# Patient Record
Sex: Male | Born: 1985 | Race: Black or African American | Hispanic: No | Marital: Single | Smoking: Never smoker
Health system: Southern US, Community
[De-identification: ages and names within clinical notes are randomized; demographics above are authoritative.]

## PROBLEM LIST (undated history)

## (undated) DIAGNOSIS — I341 Nonrheumatic mitral (valve) prolapse: Secondary | ICD-10-CM

## (undated) DIAGNOSIS — K648 Other hemorrhoids: Secondary | ICD-10-CM

## (undated) HISTORY — DX: Nonrheumatic mitral (valve) prolapse: I34.1

## (undated) HISTORY — PX: COLONOSCOPY: SHX174

## (undated) HISTORY — DX: Other hemorrhoids: K64.8

---

## 2016-06-06 DIAGNOSIS — K625 Hemorrhage of anus and rectum: Secondary | ICD-10-CM | POA: Diagnosis not present

## 2016-06-06 DIAGNOSIS — K529 Noninfective gastroenteritis and colitis, unspecified: Secondary | ICD-10-CM | POA: Diagnosis not present

## 2016-06-19 DIAGNOSIS — K648 Other hemorrhoids: Secondary | ICD-10-CM | POA: Diagnosis not present

## 2016-06-19 DIAGNOSIS — K640 First degree hemorrhoids: Secondary | ICD-10-CM | POA: Diagnosis not present

## 2016-06-19 DIAGNOSIS — R197 Diarrhea, unspecified: Secondary | ICD-10-CM | POA: Diagnosis not present

## 2016-06-19 DIAGNOSIS — K625 Hemorrhage of anus and rectum: Secondary | ICD-10-CM | POA: Diagnosis not present

## 2016-07-30 ENCOUNTER — Encounter: Payer: Self-pay | Admitting: Medical

## 2016-07-30 ENCOUNTER — Ambulatory Visit: Payer: Self-pay | Admitting: Medical

## 2016-07-30 VITALS — BP 120/90 | HR 64 | Temp 97.7°F | Resp 16 | Ht 67.0 in | Wt 200.0 lb

## 2016-07-30 DIAGNOSIS — R002 Palpitations: Secondary | ICD-10-CM

## 2016-07-30 DIAGNOSIS — K640 First degree hemorrhoids: Secondary | ICD-10-CM

## 2016-07-30 NOTE — Progress Notes (Signed)
Subjective:    Tyler Davis is a 31 y.o. male who presents with palpitations resuming one month ago. The symptoms are mild to moderate, occur intermittently and up to  5 times /day., and last only a few seconds per episode. They tend to occur while sitting down. Cardiac risk factors include: male gender No family history of cardiac issues. Aggravating factors: none. Relieving factors: none. Associated symptoms: chest tightness. Patient denies: chest pain, dizziness, shortness of breath and syncope. He does drink 4 cups of coffee throughout the day.  [facility]oscopy diagnosed with internal hemorrhoids.  He states his blood pressure was elevated during his visits 160-170/100. He does say he was anxious during his visits.  Pt also c/o  Dizziness, starting about 3 weeks ago.   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 No CP or SOB. Nothing worsens it, and he feels better when he exercises.  Denies room spinning, he feels like the floor is moving. Initially had dizziness when he travel to [place] and he thought it was due to the altitude change. When he returned to [place] the dizziness resolved, He again noticed it [place] in [place]. This time it did not resolve when her returned to [place]. Denies any tinnitus, trouble or changes in  Hearing. It occurs 2-3 times/day he describes it as mild to moderate. Lasting about  5-10 min. He denies any syncope or other CNS symptoms.    Review of Systems Constitutional: See HPI , he denies any fever or recent illnesses. Ears, nose, mouth, throat, and face: negative Respiratory: negative Cardiovascular: see HPI Neurological: negative, see HPI   Objective:    BP 120/90 (BP Location: Left Arm, Patient Position: Sitting, Cuff Size: Normal)   Pulse 64   Temp 97.7 F (36.5 C) (Tympanic)   Resp 16   Ht 5\' 7"  (1.702 m)   Wt 200 lb (90.7 kg)   SpO2 99%   BMI 31.32 kg/m   General appearance: alert, cooperative and no distress Head: Normocephalic, without obvious abnormality, atraumatic Ears:  Cerumen obscuring left Tm, only 1/4 of right TM visible otherwise with Cerumen. TM looks dark.. Lungs: clear to auscultation bilaterally Heart: regular rate and rhythm, S1, S2 normal, no murmur, click, rub or gallop Extremities: MAEW Neurologic: Grossly normal neg Rhomberg, finger to nose, heel to toe wnl. Gait wnl.   Cardiographics ECG: Bradycardic , mild nonspecific inferior ST changes (reviewed by Dr. Sullivan LoneGilbert).   Assessment:    Palpitations   Dizziness    Plan:  Referral to Cardiology - will call patientt with time and date and place. Pt to use otc Debrox otic drops 5-10 drops twice a day x  4 days and return to the clinic for irrigation.  Follow up in 4 days.

## 2016-07-30 NOTE — Progress Notes (Deleted)
Subjective:     Tyler Davis is a 31 y.o. male who presents for evaluation of dizziness. The symptoms started 3 week ago and are stable. The attacks occur {frequency:19169} and last {time dizziness:13553} {time units:19177::"minutes"}. Positions that worsen symptoms: {dizziness positions:13554}. Previous workup/treatments: {dizziness eval/tx:13555::"none"}. Associated ear symptoms: {dizziness ear symptoms:13556::"none"}. Associated CNS symptoms: {dizziness cns symptoms:13559::"none"}. Recent infections: {dizziness infections:13560}. Head trauma: {dizziness head trauma:13561}. Drug ingestion: {dizziness ingestion:13562}. Noise exposure: {dizziness noise:13563}. Family history: {dizziness fam hx:13565}.  {Common ambulatory SmartLinks:19316}  Review of Systems {ros; complete:30496}    Objective:    {exam; complete:17964}      Assessment:    {dizziness assessment:60519}    Plan:    {Vertigo treatment plan:17820}

## 2016-08-10 ENCOUNTER — Other Ambulatory Visit
Admission: RE | Admit: 2016-08-10 | Discharge: 2016-08-10 | Disposition: A | Discharge status: Home / Self Care | Payer: BLUE CROSS/BLUE SHIELD | Source: Ambulatory Visit | Attending: Student | Admitting: Student

## 2016-08-10 DIAGNOSIS — K529 Noninfective gastroenteritis and colitis, unspecified: Secondary | ICD-10-CM | POA: Diagnosis not present

## 2016-08-10 DIAGNOSIS — K625 Hemorrhage of anus and rectum: Secondary | ICD-10-CM | POA: Diagnosis not present

## 2016-08-15 ENCOUNTER — Encounter: Payer: Self-pay | Admitting: Internal Medicine

## 2016-08-15 ENCOUNTER — Ambulatory Visit (INDEPENDENT_AMBULATORY_CARE_PROVIDER_SITE_OTHER): Payer: BLUE CROSS/BLUE SHIELD | Admitting: Internal Medicine

## 2016-08-15 VITALS — BP 164/72 | HR 72 | Ht 67.0 in | Wt 200.2 lb

## 2016-08-15 DIAGNOSIS — R002 Palpitations: Secondary | ICD-10-CM | POA: Diagnosis not present

## 2016-08-15 DIAGNOSIS — R030 Elevated blood-pressure reading, without diagnosis of hypertension: Secondary | ICD-10-CM | POA: Diagnosis not present

## 2016-08-15 DIAGNOSIS — R9431 Abnormal electrocardiogram [ECG] [EKG]: Secondary | ICD-10-CM | POA: Diagnosis not present

## 2016-08-15 DIAGNOSIS — R42 Dizziness and giddiness: Secondary | ICD-10-CM | POA: Diagnosis not present

## 2016-08-15 LAB — MISCELLANEOUS TEST: Miscellaneous Test: 1800

## 2016-08-15 NOTE — Patient Instructions (Signed)
Medication Instructions:  Your physician recommends that you continue on your current medications as directed. Please refer to the Current Medication list given to you today.   Labwork: Labwork will be requested from your primary care physician.   Testing/Procedures: Your physician has requested that you have an echocardiogram. Echocardiography is a painless test that uses sound waves to create images of your heart. It provides your doctor with information about the size and shape of your heart and how well your heart's chambers and valves are working. This procedure takes approximately one hour. There are no restrictions for this procedure.  Your physician has recommended that you wear a 48 HOUR holter monitor. Holter monitors are medical devices that record the heart's electrical activity. Doctors most often use these monitors to diagnose arrhythmias. Arrhythmias are problems with the speed or rhythm of the heartbeat. The monitor is a small, portable device. You can wear one while you do your normal daily activities. This is usually used to diagnose what is causing palpitations/syncope (passing out).    Follow-Up: Your physician recommends that you schedule a follow-up appointment in: 6 WEEKS WITH DR END.   Echocardiogram An echocardiogram, or echocardiography, uses sound waves (ultrasound) to produce an image of your heart. The echocardiogram is simple, painless, obtained within a short period of time, and offers valuable information to your health care provider. The images from an echocardiogram can provide information such as:  Evidence of coronary artery disease (CAD).  Heart size.  Heart muscle function.  Heart valve function.  Aneurysm detection.  Evidence of a past heart attack.  Fluid buildup around the heart.  Heart muscle thickening.  Assess heart valve function. Tell a health care provider about:  Any allergies you have.  All medicines you are taking, including  vitamins, herbs, eye drops, creams, and over-the-counter medicines.  Any problems you or family members have had with anesthetic medicines.  Any blood disorders you have.  Any surgeries you have had.  Any medical conditions you have.  Whether you are pregnant or may be pregnant. What happens before the procedure? No special preparation is needed. Eat and drink normally. What happens during the procedure?  In order to produce an image of your heart, gel will be applied to your chest and a wand-like tool (transducer) will be moved over your chest. The gel will help transmit the sound waves from the transducer. The sound waves will harmlessly bounce off your heart to allow the heart images to be captured in real-time motion. These images will then be recorded.  You may need an IV to receive a medicine that improves the quality of the pictures. What happens after the procedure? You may return to your normal schedule including diet, activities, and medicines, unless your health care provider tells you otherwise. This information is not intended to replace advice given to you by your health care provider. Make sure you discuss any questions you have with your health care provider. Document Released: 05/11/2000 Document Revised: 12/31/2015 Document Reviewed: 01/19/2013 Elsevier Interactive Patient Education  2017 Elsevier Inc.    48 HOUR Holter Monitoring A Holter monitor is a small device that is used to detect abnormal heart rhythms. It clips to your clothing and is connected by wires to flat, sticky disks (electrodes) that attach to your chest. It is worn continuously for 24-48 hours. Follow these instructions at home:  Wear your Holter monitor at all times, even while exercising and sleeping, for as long as directed by your health  care provider.  Make sure that the Holter monitor is safely clipped to your clothing or close to your body as recommended by your health care provider.  Do  not get the monitor or wires wet.  Do not put body lotion or moisturizer on your chest.  Keep your skin clean.  Keep a diary of your daily activities, such as walking and doing chores. If you feel that your heartbeat is abnormal or that your heart is fluttering or skipping a beat:  Record what you are doing when it happens.  Record what time of day the symptoms occur.  Return your Holter monitor as directed by your health care provider.  Keep all follow-up visits as directed by your health care provider. This is important. Get help right away if:  You feel lightheaded or you faint.  You have trouble breathing.  You feel pain in your chest, upper arm, or jaw.  You feel sick to your stomach and your skin is pale, cool, or damp.  You heartbeat feels unusual or abnormal. This information is not intended to replace advice given to you by your health care provider. Make sure you discuss any questions you have with your health care provider. Document Released: 02/10/2004 Document Revised: 10/20/2015 Document Reviewed: 12/21/2013 Elsevier Interactive Patient Education  2017 [employer].

## 2016-08-15 NOTE — Progress Notes (Signed)
New Outpatient Visit Date: 08/15/2016  Referring Provider: Doy Mince, PA-C [address] [place], [place] [address]  Chief Complaint: Palpitations  HPI:  Tyler Davis is a 31 y.o. year-old male with history of internal hemorrhoids, who has been referred by Dr. Cranston Neighbor for evaluation of palpitations. Patient reports that he first experienced palpitations in the summer of 2017. He would feel occasional skipped beats, most pronounced when lying in bed at night. These became more frequent in 03/2016. At the time, he was undergoing evaluation of rectal bleeding with concern for colon cancer, which placed him under considerable stress. He ultimately underwent colonoscopy that confirmed internal hemorrhoids without other significant abnormalities. Lab abnormalities during this workup revealed vitamin D deficiency as well as an elevated C-reactive protein. He continues to have "skipped beats" most days of the week. They are not related to any particular activity and can occur both during the day and at night. He notes mild shortness of breath with the palpitations. He denies other associated symptoms including chest pain and lightheadedness.  The patient notes dizziness from time to time that he describes as a sensation of being off balance. He has not fallen or passed out. This first began while visiting [place] last year. It resolved on its own but returned after a trip to [place] last month. He has continued to have some dizziness from time to time. He exercises regularly without any limitations. In fact, he reports feeling better when exercising. He has gained about 15 pounds over the last year, which he attributes to decreased activity. He denies orthopnea, PND, leg edema, and claudication. He drinks up to 5-6 cups of caffeinated coffee per day but no sodas or energy drinks. He drinks 2 alcoholic beverages a day. He does not use drugs or tobacco products. He denies a family history of cardiac  disease, including sudden cardiac death. His only prior cardiac workup has been an EKG. He notes some elevated blood pressure readings in the past but has not been diagnosed with hypertension.  --------------------------------------------------------------------------------------------------  Cardiovascular History & Procedures: Cardiovascular Problems:  Palpitations  Risk Factors:  Male gender  Cath/PCI:  None  CV Surgery:  None  EP Procedures and Devices:  None  Non-Invasive Evaluation(s):  None  Recent CV Pertinent Labs: No results found for: CHOL, HDL, LDLCALC, LDLDIRECT, TRIG, CHOLHDL, INR, BNP, K, MG, BUN, CREATININE  --------------------------------------------------------------------------------------------------  Past Medical History:  Diagnosis Date  . Internal hemorrhoids     Past Surgical History:  Procedure Laterality Date  . COLONOSCOPY      Outpatient Encounter Prescriptions as of 08/15/2016  Medication Sig  . aspirin 81 MG chewable tablet Chew 81 mg by mouth daily.  . Cholecalciferol (VITAMIN D3) 5000 units CAPS Take 1 capsule by mouth daily.   No facility-administered encounter medications on file as of 08/15/2016.     Allergies: Patient has no known allergies.  Social History   Social History  . Marital status: Single    Spouse name: N/A  . Number of children: N/A  . Years of education: N/A   Occupational History  . Not on file.   Social History Main Topics  . Smoking status: Never Smoker  . Smokeless tobacco: Never Used  . Alcohol use 1.2 oz/week    2 Cans of beer per week  . Drug use: No  . Sexual activity: Not on file   Other Topics Concern  . Not on file   Social History Narrative  . No narrative on  file    Family History  Problem Relation Age of Onset  . Hypertension Mother   . Diabetes Mellitus II Father   . Hypertension Father   . Allergies Sister     Review of Systems: A 12-system review of systems was  performed and was negative except as noted in the HPI.  --------------------------------------------------------------------------------------------------  Physical Exam: BP (!) 164/72 (BP Location: Left Arm, Patient Position: Sitting, Cuff Size: Normal)   Pulse 72   Ht 5\' 7"  (1.702 m)   Wt 200 lb 4 oz (90.8 kg)   BMI 31.36 kg/m   Repeat BP 146/96  General:  Well-developed, well-nourished man seated comfortably in the exam room. HEENT: No conjunctival pallor or scleral icterus.  Moist mucous membranes.  OP clear. Neck: Supple without lymphadenopathy, thyromegaly, JVD, or HJR.  No carotid bruit. Lungs: Normal work of breathing.  Clear to auscultation bilaterally without wheezes or crackles. Heart: Regular rate and rhythm without murmurs, rubs, or gallops.  Non-displaced PMI. Abd: Bowel sounds present.  Soft, NT/ND without hepatosplenomegaly Ext: No lower extremity edema.  Radial, PT, and DP pulses are 2+ bilaterally Skin: warm and dry without rash Neuro: CNIII-XII intact.  Strength and fine-touch sensation intact in upper and lower extremities bilaterally. Psych: Normal mood and affect.  EKG:  Normal sinus rhythm with first-degree AV block and nonspecific T-wave changes.  No results found for: WBC, HGB, HCT, MCV, PLT  No results found for: NA, K, CL, CO2, BUN, CREATININE, GLUCOSE, ALT  No results found for: CHOL, HDL, LDLCALC, LDLDIRECT, TRIG, CHOLHDL   --------------------------------------------------------------------------------------------------  ASSESSMENT AND PLAN: Palpitations Symptoms are most consistent with PACs or PVCs. Patient notes mild accompanying shortness of breath but otherwise no worrisome symptoms. We have agreed to obtain a 48-hour Holter monitor and transthoracic echocardiogram for further evaluation. I recommend decreasing caffeine consumption. I have also requested lab reports from the patient's PCP with a particular attention on electrolytes and  TSH.  Abnormal EKG EKG today demonstrates first-degree AV block and nonspecific T-wave changes. I have low suspicion for obstructive CAD, particularly since the patient is able to perform vigorous exercise without any symptoms. We will obtain a transthoracic echocardiogram and defer stress testing pending echo and Holter monitor.  Dizziness Symptoms are predominantly an off-balance sensation. No loss of consciousness or falls. Symptoms do not correspond with palpitations. It is reasonable to obtain a transthoracic echocardiogram to evaluate for structural abnormalities.  Elevated blood pressure Blood pressure is high today. Diastolic pressure was borderline elevated at PCP visit earlier this month. We will need to monitor this closely and consider initiation of therapy if this remains elevated.  Follow-up: Return to clinic in 6 weeks.  Yvonne Kendallhristopher Musette Kisamore, MD 08/15/2016 9:08 AM

## 2016-09-17 ENCOUNTER — Ambulatory Visit (INDEPENDENT_AMBULATORY_CARE_PROVIDER_SITE_OTHER): Payer: BLUE CROSS/BLUE SHIELD

## 2016-09-17 DIAGNOSIS — R002 Palpitations: Secondary | ICD-10-CM | POA: Diagnosis not present

## 2016-09-17 DIAGNOSIS — R42 Dizziness and giddiness: Secondary | ICD-10-CM

## 2016-09-19 ENCOUNTER — Ambulatory Visit (INDEPENDENT_AMBULATORY_CARE_PROVIDER_SITE_OTHER): Payer: BLUE CROSS/BLUE SHIELD

## 2016-09-19 ENCOUNTER — Other Ambulatory Visit: Payer: Self-pay

## 2016-09-19 DIAGNOSIS — R42 Dizziness and giddiness: Secondary | ICD-10-CM | POA: Diagnosis not present

## 2016-09-19 DIAGNOSIS — R002 Palpitations: Secondary | ICD-10-CM | POA: Diagnosis not present

## 2016-09-20 ENCOUNTER — Ambulatory Visit
Admission: RE | Admit: 2016-09-20 | Discharge: 2016-09-20 | Disposition: A | Discharge status: Home / Self Care | Payer: BLUE CROSS/BLUE SHIELD | Source: Ambulatory Visit | Attending: Internal Medicine | Admitting: Internal Medicine

## 2016-09-20 DIAGNOSIS — R002 Palpitations: Secondary | ICD-10-CM | POA: Insufficient documentation

## 2016-09-26 ENCOUNTER — Ambulatory Visit (INDEPENDENT_AMBULATORY_CARE_PROVIDER_SITE_OTHER): Payer: BLUE CROSS/BLUE SHIELD | Admitting: Internal Medicine

## 2016-09-26 ENCOUNTER — Encounter: Payer: Self-pay | Admitting: Internal Medicine

## 2016-09-26 VITALS — BP 150/98 | HR 70 | Ht 67.0 in | Wt 198.5 lb

## 2016-09-26 DIAGNOSIS — I341 Nonrheumatic mitral (valve) prolapse: Secondary | ICD-10-CM

## 2016-09-26 DIAGNOSIS — I10 Essential (primary) hypertension: Secondary | ICD-10-CM | POA: Diagnosis not present

## 2016-09-26 DIAGNOSIS — R002 Palpitations: Secondary | ICD-10-CM

## 2016-09-26 NOTE — Progress Notes (Signed)
 Follow-up Outpatient Visit Date: 09/26/2016  Primary Care Provider: Heather R Ratcliffe, PA-C [address] [place] [address]  Chief Complaint: Follow-up palpitations  HPI:  Mr. Tyler Davis is a 31 y.o. year-old male with history of internal hemorrhoids, who presents for follow-up of palpitations. I first met him on 08/15/16, which time he reported few months of intermittent palpitations and lightheadedness. We agreed to obtain a Holter monitor and transthoracic echocardiogram. These were notable for rare PVCs and mild mitral valve prolapse. Since our last visit, Mr. Cornette reports that his palpitations have improved significantly. They still occur sporadically but are not accompanied with any other symptoms. He has not had any chest pain, shortness of breath, or lightheadedness. He continues to exercise on a regular basis without limitations. He notes today that his sister recently shared with him that she also has mitral valve prolapse.  --------------------------------------------------------------------------------------------------  Cardiovascular History & Procedures: Cardiovascular Problems:  Dictations  Mitral prolapse  Risk Factors:  Male gender  Cath/PCI:  None  CV Surgery:  None  EP Procedures and Devices:  48 hour Holter monitor (09/17/16): Predominantly sinus rhythm with rare PVCs.  Non-Invasive Evaluation(s):  TTE (09/19/16): Normal LV size and function with LVEF of 60-65%. Normal wall motion and diastolic parameters. 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 No MR. No other valvular abnormalities. Normal RV size and function.  Recent CV Pertinent Labs: No results found for: CHOL, HDL, LDLCALC, LDLDIRECT, TRIG, CHOLHDL, INR, BNP, K, MG, BUN, CREATININE  Past medical and surgical history were reviewed and updated in EPIC.  Outpatient Encounter Prescriptions as of 09/26/2016  Medication Sig  . aspirin 81 MG chewable tablet Chew  81 mg by mouth daily.  . Cholecalciferol (VITAMIN D3) 5000 units CAPS Take 1 capsule by mouth daily.   No facility-administered encounter medications on file as of 09/26/2016.     Allergies: Patient has no known allergies.  Social History   Social History  . Marital status: Single    Spouse name: N/A  . Number of children: N/A  . Years of education: N/A   Occupational History  . Not on file.   Social History Main Topics  . Smoking status: Never Smoker  . Smokeless tobacco: Never Used  . Alcohol use 1.2 oz/week    2 Cans of beer per week     Comment: daily  . Drug use: No  . Sexual activity: Not on file   Other Topics Concern  . Not on file   Social History Narrative  . No narrative on file    Family History  Problem Relation Age of Onset  . Hypertension Mother   . Diabetes Mellitus II Father   . Hypertension Father   . Allergies Sister   . Mitral valve prolapse Sister     Review of Systems: A 12-system review of systems was performed and was negative except as noted in the HPI.  --------------------------------------------------------------------------------------------------  Physical Exam: BP (!) 150/98 (BP Location: Left Arm, Patient Position: Sitting, Cuff Size: Normal)   Pulse 70   Ht 5' 7" (1.702 m)   Wt 198 lb 8 oz (90 kg)   BMI 31.09 kg/m   General:  Well-developed, well-nourished man seated comfortably in the exam room. HEENT: No conjunctival pallor or scleral icterus.  Moist mucous membranes.  OP clear. Neck: Supple without lymphadenopathy, thyromegaly, JVD, or HJR.  No carotid bruit. Lungs: Normal work of breathing.  Clear to auscultation bilaterally without wheezes or crackles. Heart:   Regular rate and rhythm without murmurs, rubs, or gallops.  Non-displaced PMI. Abd: Bowel sounds present.  Soft, NT/ND without hepatosplenomegaly Ext: No lower extremity edema.  Radial, PT, and DP pulses are 2+ bilaterally. Skin: warm and dry without rash  No  results found for: WBC, HGB, HCT, MCV, PLT  No results found for: NA, K, CL, CO2, BUN, CREATININE, GLUCOSE, ALT  No results found for: CHOL, HDL, LDLCALC, LDLDIRECT, TRIG, CHOLHDL  --------------------------------------------------------------------------------------------------  ASSESSMENT AND PLAN: Palpitations Symptoms have improved since our last visit. Recent Holter monitor showed rare PACs and PVCs. Echocardiogram was notable for mild mitral valve prolapse, which may be contributing to his symptoms. We have agreed to defer further testing and interventions at this time. I encouraged Mr. Taney to continue avoidance of caffeine.  Mitral valve prolapse This was incidentally noted on recent echo without evidence of mitral regurgitation. We will continue clinical follow-up. Patient was advised to contact us if he develops symptoms of heart failure, including shortness of breath, orthopnea, and edema. There is no indication for antibiotic prophylaxis with MVP.  Hypertension Blood pressure noted to be modestly elevated again today. We discussed pharmacotherapy versus lifestyle modifications. He would like to pursue lifestyle modifications first. I provided him with information about the DASH diet. I have advised him to see his PCP in about 3 months for reevaluation of his blood pressure. If there remains elevated at that time, pharmacologic therapy should be instituted.  Follow-up: Return to clinic in 1 year.  Christopher End, MD 09/27/2016 5:17 PM  

## 2016-09-26 NOTE — Patient Instructions (Addendum)
Medication Instructions:  Your physician recommends that you continue on your current medications as directed. Please refer to the Current Medication list given to you today.   Labwork: none  Testing/Procedures: none  Follow-Up: Your physician wants you to follow-up in: one year with Dr. Okey Dupre.  You will receive a reminder letter in the mail two months in advance. If you don't receive a letter, please call our office to schedule the follow-up appointment.   Any Other Special Instructions Will Be Listed Below (If Applicable).      If you need a refill on your cardiac medications before your next appointment, please call your pharmacy.   DASH Eating Plan DASH stands for "Dietary Approaches to Stop Hypertension." The DASH eating plan is a healthy eating plan that has been shown to reduce high blood pressure (hypertension). It may also reduce your risk for type 2 diabetes, heart disease, and stroke. The DASH eating plan may also help with weight loss. What are tips for following this plan? General guidelines   Avoid eating more than 2,300 mg (milligrams) of salt (sodium) a day. If you have hypertension, you may need to reduce your sodium intake to 1,500 mg a day.  Limit alcohol intake to no more than 1 drink a day for nonpregnant women and 2 drinks a day for men. One drink equals 12 oz of beer, 5 oz of wine, or 1 oz of hard liquor.  Work with your health care provider to maintain a healthy body weight or to lose weight. Ask what an ideal weight is for you.  Get at least 30 minutes of exercise that causes your heart to beat faster (aerobic exercise) most days of the week. Activities may include walking, swimming, or biking.  Work with your health care provider or diet and nutrition specialist (dietitian) to adjust your eating plan to your individual calorie needs. Reading food labels   Check food labels for the amount of sodium per serving. Choose foods with less than 5 percent of  the Daily Value of sodium. Generally, foods with less than 300 mg of sodium per serving fit into this eating plan.  To find whole grains, look for the word "whole" as the first word in the ingredient list. Shopping   Buy products labeled as "low-sodium" or "no salt added."  Buy fresh foods. Avoid canned foods and premade or frozen meals. Cooking   Avoid adding salt when cooking. Use salt-free seasonings or herbs instead of table salt or sea salt. Check with your health care provider or pharmacist before using salt substitutes.  Do not fry foods. Cook foods using healthy methods such as baking, boiling, grilling, and broiling instead.  Cook with heart-healthy oils, such as olive, canola, soybean, or sunflower oil. Meal planning    Eat a balanced diet that includes:  5 or more servings of fruits and vegetables each day. At each meal, try to fill half of your plate with fruits and vegetables.  Up to 6-8 servings of whole grains each day.  Less than 6 oz of lean meat, poultry, or fish each day. A 3-oz serving of meat is about the same size as a deck of cards. One egg equals 1 oz.  2 servings of low-fat dairy each day.  A serving of nuts, seeds, or beans 5 times each week.  Heart-healthy fats. Healthy fats called Omega-3 fatty acids are found in foods such as flaxseeds and coldwater fish, like sardines, salmon, and mackerel.  Limit how much you  eat of the following:  Canned or prepackaged foods.  Food that is high in trans fat, such as fried foods.  Food that is high in saturated fat, such as fatty meat.  Sweets, desserts, sugary drinks, and other foods with added sugar.  Full-fat dairy products.  Do not salt foods before eating.  Try to eat at least 2 vegetarian meals each week.  Eat more home-cooked food and less restaurant, buffet, and fast food.  When eating at a restaurant, ask that your food be prepared with less salt or no salt, if possible. What foods are  recommended? The items listed may not be a complete list. Talk with your dietitian about what dietary choices are best for you. Grains  Whole-grain or whole-wheat bread. Whole-grain or whole-wheat pasta. Brown rice. Orpah Cobb. Bulgur. Whole-grain and low-sodium cereals. Pita bread. Low-fat, low-sodium crackers. Whole-wheat flour tortillas. Vegetables  Fresh or frozen vegetables (raw, steamed, roasted, or grilled). Low-sodium or reduced-sodium tomato and vegetable juice. Low-sodium or reduced-sodium tomato sauce and tomato paste. Low-sodium or reduced-sodium canned vegetables. Fruits  All fresh, dried, or frozen fruit. Canned fruit in natural juice (without added sugar). Meat and other protein foods  Skinless chicken or [place]. Ground chicken or [place]. Pork with fat trimmed off. Fish and seafood. Egg whites. Dried beans, peas, or lentils. Unsalted nuts, nut butters, and seeds. Unsalted canned beans. Lean cuts of beef with fat trimmed off. Low-sodium, lean deli meat. Dairy  Low-fat (1%) or fat-free (skim) milk. Fat-free, low-fat, or reduced-fat cheeses. Nonfat, low-sodium ricotta or cottage cheese. Low-fat or nonfat yogurt. Low-fat, low-sodium cheese. Fats and oils  Soft margarine without trans fats. Vegetable oil. Low-fat, reduced-fat, or light mayonnaise and salad dressings (reduced-sodium). Canola, safflower, olive, soybean, and sunflower oils. Avocado. Seasoning and other foods  Herbs. Spices. Seasoning mixes without salt. Unsalted popcorn and pretzels. Fat-free sweets. What foods are not recommended? The items listed may not be a complete list. Talk with your dietitian about what dietary choices are best for you. Grains  Baked goods made with fat, such as croissants, muffins, or some breads. Dry pasta or rice meal packs. Vegetables  Creamed or fried vegetables. Vegetables in a cheese sauce. Regular canned vegetables (not low-sodium or reduced-sodium). Regular canned tomato sauce and  paste (not low-sodium or reduced-sodium). Regular tomato and vegetable juice (not low-sodium or reduced-sodium). Rosita Fire. Olives. Fruits  Canned fruit in a light or heavy syrup. Fried fruit. Fruit in cream or butter sauce. Meat and other protein foods  Fatty cuts of meat. Ribs. Fried meat. Tomasa Blase. Sausage. Bologna and other processed lunch meats. Salami. Fatback. Hotdogs. Bratwurst. Salted nuts and seeds. Canned beans with added salt. Canned or smoked fish. Whole eggs or egg yolks. Chicken or [place] with skin. Dairy  Whole or 2% milk, cream, and half-and-half. Whole or full-fat cream cheese. Whole-fat or sweetened yogurt. Full-fat cheese. Nondairy creamers. Whipped toppings. Processed cheese and cheese spreads. Fats and oils  Butter. Stick margarine. Lard. Shortening. Ghee. Bacon fat. Tropical oils, such as coconut, palm kernel, or palm oil. Seasoning and other foods  Salted popcorn and pretzels. Onion salt, garlic salt, seasoned salt, table salt, and sea salt. Worcestershire sauce. Tartar sauce. Barbecue sauce. Teriyaki sauce. Soy sauce, including reduced-sodium. Steak sauce. Canned and packaged gravies. Fish sauce. Oyster sauce. Cocktail sauce. Horseradish that you find on the shelf. Ketchup. Mustard. Meat flavorings and tenderizers. Bouillon cubes. Hot sauce and Tabasco sauce. Premade or packaged marinades. Premade or packaged taco seasonings. Relishes. Regular salad dressings. Where  to find more information:  National Heart, Lung, and Blood Institute: PopSteam.is  American Heart Association: www.heart.org Summary  The DASH eating plan is a healthy eating plan that has been shown to reduce high blood pressure (hypertension). It may also reduce your risk for type 2 diabetes, heart disease, and stroke.  With the DASH eating plan, you should limit salt (sodium) intake to 2,300 mg a day. If you have hypertension, you may need to reduce your sodium intake to 1,500 mg a day.  When on the DASH  eating plan, aim to eat more fresh fruits and vegetables, whole grains, lean proteins, low-fat dairy, and heart-healthy fats.  Work with your health care provider or diet and nutrition specialist (dietitian) to adjust your eating plan to your individual calorie needs. This information is not intended to replace advice given to you by your health care provider. Make sure you discuss any questions you have with your health care provider. Document Released: 05/03/2011 Document Revised: 05/07/2016 Document Reviewed: 05/07/2016 Elsevier Interactive Patient Education  2017 [employer].

## 2016-09-27 ENCOUNTER — Encounter: Payer: Self-pay | Admitting: Internal Medicine

## 2016-09-27 DIAGNOSIS — I341 Nonrheumatic mitral (valve) prolapse: Secondary | ICD-10-CM | POA: Insufficient documentation

## 2016-09-27 DIAGNOSIS — I10 Essential (primary) hypertension: Secondary | ICD-10-CM | POA: Insufficient documentation

## 2016-10-18 ENCOUNTER — Encounter: Payer: Self-pay | Admitting: Medical

## 2016-11-22 ENCOUNTER — Ambulatory Visit
Admission: RE | Admit: 2016-11-22 | Discharge: 2016-11-22 | Disposition: A | Discharge status: Home / Self Care | Payer: BLUE CROSS/BLUE SHIELD | Source: Ambulatory Visit | Attending: Medical | Admitting: Medical

## 2016-11-22 ENCOUNTER — Encounter: Payer: Self-pay | Admitting: Medical

## 2016-11-22 ENCOUNTER — Ambulatory Visit: Payer: Self-pay | Admitting: Medical

## 2016-11-22 ENCOUNTER — Other Ambulatory Visit: Payer: Self-pay | Admitting: Medical

## 2016-11-22 ENCOUNTER — Ambulatory Visit: Admission: RE | Admit: 2016-11-22 | Payer: BLUE CROSS/BLUE SHIELD | Source: Ambulatory Visit

## 2016-11-22 ENCOUNTER — Ambulatory Visit: Payer: BLUE CROSS/BLUE SHIELD

## 2016-11-22 VITALS — BP 140/90 | HR 101 | Temp 98.2°F | Resp 18 | Ht 67.0 in | Wt 195.0 lb

## 2016-11-22 DIAGNOSIS — N50811 Right testicular pain: Secondary | ICD-10-CM

## 2016-11-22 NOTE — Progress Notes (Signed)
   Subjective:    Patient ID: Tyler Davis, male    DOB: Aug 21, 1985, 31 y.o.   MRN: [id number]  HPI  31 yo male started with right testicle pain on /off starting in Jan 2018.  Then increased to daily but not constant.  Now he can "feel it " constantly. No penile pain or discharge.   Review of Systems  Constitutional: Negative for chills and fever.  HENT: Negative for congestion, ear pain and sore throat.   Eyes: Negative for discharge and itching.  Respiratory: Negative for shortness of breath.   Cardiovascular: Negative for chest pain.  Gastrointestinal: Negative for abdominal pain.  Genitourinary: Negative for hematuria.  Musculoskeletal: Positive for back pain.  Skin: Negative for rash.  Allergic/Immunologic: Negative for environmental allergies and food allergies.  Neurological: Negative for dizziness and syncope.  Hematological: Negative for adenopathy.   Seen by cardiology said he did not need to take ASA 81 mg anymore.    Objective:   Physical Exam  Constitutional: He appears well-developed and well-nourished.  HENT:  Head: Normocephalic and atraumatic.  Eyes: Pupils are equal, round, and reactive to light.  Neck: Normal range of motion.  Genitourinary: Penis normal. Right testis shows swelling and tenderness. Uncircumcised.  Lymphadenopathy:       Right: No inguinal adenopathy present.       Left: No inguinal adenopathy present.  Nursing note and vitals reviewed.  posterior right testicle boggy feeling.  No inguinal adenopathy bilateral. No discharge on GU exam.      Assessment & Plan:  Right testicle pain will do STAT U/S with doppler. Will call patient with results.  Called patient with test results completely normal. , will treat with Rocephin  250 mg  return to the clinic tomorrow, One gram  By mouth of Azithromycin single dose. .Will get urine sample for GC/Chlamydia testing.

## 2016-11-23 ENCOUNTER — Ambulatory Visit: Payer: Self-pay | Admitting: Medical

## 2016-11-23 ENCOUNTER — Encounter: Payer: Self-pay | Admitting: Medical

## 2016-11-23 VITALS — BP 126/80 | HR 80 | Temp 97.8°F | Resp 16 | Ht 67.0 in | Wt 195.0 lb

## 2016-11-23 DIAGNOSIS — N50811 Right testicular pain: Secondary | ICD-10-CM

## 2016-11-23 LAB — POCT URINALYSIS DIPSTICK
Bilirubin, UA: NEGATIVE
Blood, UA: NEGATIVE
Glucose, UA: NEGATIVE
KETONES UA: NEGATIVE
LEUKOCYTES UA: NEGATIVE
Nitrite, UA: NEGATIVE
PROTEIN UA: NEGATIVE
Spec Grav, UA: 1.025 (ref 1.010–1.025)
Urobilinogen, UA: 0.2 E.U./dL
pH, UA: 5 (ref 5.0–8.0)

## 2016-11-23 MED ORDER — AZITHROMYCIN 250 MG PO TABS
ORAL_TABLET | ORAL | 0 refills | Status: DC
Start: 2016-11-23 — End: 2017-06-24

## 2016-11-23 MED ORDER — CEFTRIAXONE SODIUM 250 MG IJ SOLR
250.0000 mg | Freq: Once | INTRAMUSCULAR | Status: AC
  Administered 2016-11-23: 250 mg via INTRAMUSCULAR

## 2016-11-23 NOTE — Progress Notes (Signed)
   Subjective:    Patient ID: Tyler Davis, male    DOB: 10-22-1985, 31 y.o.   MRN: [id number]  HPI 31 yo male returns to clinic for treatment possible GC/ chlamydia.  Patient mentions  today that he cycles between 80-100 miles per week (approximately)   Usually riding  3 days a week. Does wear padded shorts, but no special biking seat.  He states he  has had the pain in the right testicle before he was biking many miles but thought he would inform me.  Recent doppler ultrasound and scrotal u/s all within normal limits.   Review of Systems  No changes from yesterday. Denies rectal intercourse.    Objective:   Physical Exam  Constitutional: He is oriented to person, place, and time. He appears well-developed and well-nourished.  HENT:  Head: Normocephalic and atraumatic.  Eyes: EOM are normal. Pupils are equal, round, and reactive to light.  Neck: Normal range of motion.  Neurological: He is alert and oriented to person, place, and time.  Skin: Skin is warm and dry.  Psychiatric: He has a normal mood and affect. His behavior is normal. Judgment and thought content normal.  Nursing note and vitals reviewed.    Urine dip  Within normal limits. urine Sent for GC/Chlamydia testing     Assessment & Plan:  Right testicle pain, will treat for GC and Chlamydia. U/S from yesterday reviewed with patient yesterday within normal limits. Asked patient to return to clinic next week if pain does not resolve, will then refer to Urology.  Given  Rocephin  250 mg IM and Azithromycin 250 mg tablets , take all four tablets at the same sitting  With food. #4 no refills Reviewed with patient importance of  hydration  while biking and to use Gatorade for electrolyte replacement. Will contact patient once results are received on urine. Patient verbalizes understanding, with no questions at discharge.

## 2016-11-26 LAB — GC/CHLAMYDIA PROBE AMP
Chlamydia trachomatis, NAA: NEGATIVE
Neisseria gonorrhoeae by PCR: NEGATIVE

## 2016-11-29 ENCOUNTER — Telehealth: Payer: Self-pay | Admitting: Medical

## 2016-11-29 ENCOUNTER — Ambulatory Visit: Payer: Self-pay | Admitting: Medical

## 2016-11-29 NOTE — Telephone Encounter (Signed)
Patient called says he is feeling much better and was going to reschedule appointment.  Reviewed tests with patient  GC/Chlamydia all negative.  Urine within normal limits. He states his pain is now gone. I will leave it as return to the clinic as needed. He is agreeable to that and has no  more questions at this time. Most likely a bacterial infection and the antibiotics took care of the infection. If his pain returns he knows to come to the clinic and we will refer him to urology.

## 2017-06-19 ENCOUNTER — Ambulatory Visit: Payer: Self-pay | Admitting: Medical

## 2017-06-24 ENCOUNTER — Ambulatory Visit: Payer: Self-pay | Admitting: Medical

## 2017-06-24 VITALS — BP 130/80 | HR 70 | Temp 97.6°F | Resp 16 | Wt 196.8 lb

## 2017-06-24 DIAGNOSIS — R109 Unspecified abdominal pain: Secondary | ICD-10-CM

## 2017-06-24 DIAGNOSIS — Z0000 Encounter for general adult medical examination without abnormal findings: Secondary | ICD-10-CM

## 2017-06-24 NOTE — Patient Instructions (Signed)
Medical Screening Exam A medical screening exam helps determine whether or not you need emergency medical treatment. During the medical screening exam, a health care provider does a short physical exam and medical history to assess:  Your current symptoms.  Your overall health.  Depending on your symptoms, you may need additional tests. What are the possible outcomes of a medical screening exam? Your medical screening exam may determine that:  You do not need emergency treatment at this time.  You need treatment right away.  You need to be transferred to another medical center.  When should I seek medical care? If you have a regular health care provider, make an appointment for a follow-up visit with him or her. If you do not have a regular health care provider, ask about resources in your community. Get help right away if: Your condition may change over time. If your condition gets worse or you develop new or troubling symptoms before you see your health care provider, go to an emergency department right away. In an emergency:  Call 911 or have someone drive you to the nearest hospital.  Do not drive yourself. This information is not intended to replace advice given to you by your health care provider. Make sure you discuss any questions you have with your health care provider. Document Released: 06/21/2004 Document Revised: 01/24/2016 Document Reviewed: 02/24/2015 Elsevier Interactive Patient Education  [employer]2018 Elsevier Inc.

## 2017-06-24 NOTE — Progress Notes (Signed)
   Subjective:    Patient ID: Tyler Davis, male    DOB: [birth year], 32 y.o.   MRN: [id number]  HPI   32 yo male in non acute distress,   1)Needs lab work for immigration.  2)Needs annual blood.  3) Abdominal pain right side upper and lower,Regular bowel movements , no blood in stools.   Rates heaviness  , on and off discomfort 4/10 on right side upper and lower, notices it after eating..Thought may be due to him working his abdomen.   Review of Systems  Constitutional: Negative for chills and fever.  HENT: Negative for congestion, ear pain and sore throat.   Respiratory: Negative for cough and shortness of breath.   Cardiovascular: Negative for chest pain.  Gastrointestinal: Positive for abdominal pain (heaviness in the abdomen in upper and lower, on and off, dull , feels like it flutters. better the last few days. ). Negative for anal bleeding, blood in stool, constipation, diarrhea, nausea, rectal pain and vomiting.  Genitourinary: Negative for dysuria.  Musculoskeletal: Negative for myalgias (occasional mid back pain, got a new mattress , now sleeping in guest bedroom , pain is now improved signigicantly per patient.).  Skin: Negative for rash.  Neurological: Negative for dizziness, syncope and light-headedness.  Hematological: Negative for adenopathy.  Psychiatric/Behavioral: Negative for behavioral problems, self-injury and suicidal ideas. The patient is not nervous/anxious.    Sometimes with eating has heaviness with eating in the lower right quadrant.     Objective:   Physical Exam  Constitutional: He is oriented to person, place, and time. He appears well-developed and well-nourished.  HENT:  Head: Normocephalic and atraumatic.  Eyes: Conjunctivae and EOM are normal. Pupils are equal, round, and reactive to light.  Cardiovascular: Normal rate, regular rhythm and normal heart sounds.  Pulmonary/Chest: Effort normal and breath sounds normal.  Abdominal: Soft. Bowel sounds  are normal. He exhibits no distension. There is no tenderness. There is no rebound and no guarding.  Neurological: He is alert and oriented to person, place, and time.  Skin: Skin is warm and dry.  Psychiatric: He has a normal mood and affect. His behavior is normal. Judgment and thought content normal.   Non tender on abdominal exam. Good BS all 4 quadrants.       Assessment & Plan:  Blood work/ abdominal pain. Patient to call and let me know what blood work he needs for immigration. Will try to add on to the current blood work.  Patient to schedule yearly physical exam 1-2 weeks.Marland Kitchen. Rest abdominal  exercise training to see if that improves abdominal pain for reevaluation when physical exam takes place. To return sooner if any changes occur..  Patient verbalizes understanding and has no questions at discharge.

## 2017-06-25 LAB — CMP12+LP+TP+TSH+6AC+PSA+CBC…
ALBUMIN: 4.9 g/dL (ref 3.5–5.5)
ALT: 10 IU/L (ref 0–44)
AST: 16 IU/L (ref 0–40)
Albumin/Globulin Ratio: 1.7 (ref 1.2–2.2)
Alkaline Phosphatase: 43 IU/L (ref 39–117)
BASOS: 1 %
BUN / CREAT RATIO: 13 (ref 9–20)
BUN: 16 mg/dL (ref 6–20)
Basophils Absolute: 0 10*3/uL (ref 0.0–0.2)
Bilirubin Total: 0.3 mg/dL (ref 0.0–1.2)
CALCIUM: 10 mg/dL (ref 8.7–10.2)
CHOLESTEROL TOTAL: 194 mg/dL (ref 100–199)
Chloride: 100 mmol/L (ref 96–106)
Chol/HDL Ratio: 3.1 ratio (ref 0.0–5.0)
Creatinine, Ser: 1.26 mg/dL (ref 0.76–1.27)
EOS (ABSOLUTE): 0.1 10*3/uL (ref 0.0–0.4)
Eos: 3 %
FREE THYROXINE INDEX: 2 (ref 1.2–4.9)
GFR calc Af Amer: 87 mL/min/{1.73_m2} (ref >59)
GFR calc non Af Amer: 75 mL/min/{1.73_m2} (ref >59)
GGT: 33 IU/L (ref 0–65)
GLOBULIN, TOTAL: 2.9 g/dL (ref 1.5–4.5)
Glucose: 83 mg/dL (ref 65–99)
HDL: 63 mg/dL (ref >39)
Hematocrit: 43.3 % (ref 37.5–51.0)
Hemoglobin: 14.8 g/dL (ref 13.0–17.7)
IMMATURE GRANULOCYTES: 0 %
Immature Grans (Abs): 0 10*3/uL (ref 0.0–0.1)
Iron: 71 ug/dL (ref 38–169)
LDH: 176 IU/L (ref 121–224)
LDL Calculated: 114 mg/dL — ABNORMAL HIGH (ref 0–99)
LYMPHS ABS: 1 10*3/uL (ref 0.7–3.1)
Lymphs: 31 %
MCH: 31.3 pg (ref 26.6–33.0)
MCHC: 34.2 g/dL (ref 31.5–35.7)
MCV: 92 fL (ref 79–97)
MONOS ABS: 0.3 10*3/uL (ref 0.1–0.9)
Monocytes: 9 %
NEUTROS ABS: 1.9 10*3/uL (ref 1.4–7.0)
NEUTROS PCT: 56 %
PHOSPHORUS: 3.9 mg/dL (ref 2.5–4.5)
POTASSIUM: 4.8 mmol/L (ref 3.5–5.2)
PROSTATE SPECIFIC AG, SERUM: 0.4 ng/mL (ref 0.0–4.0)
Platelets: 325 10*3/uL (ref 150–379)
RBC: 4.73 x10E6/uL (ref 4.14–5.80)
RDW: 13.7 % (ref 12.3–15.4)
SODIUM: 142 mmol/L (ref 134–144)
T3 Uptake Ratio: 30 % (ref 24–39)
T4 TOTAL: 6.7 ug/dL (ref 4.5–12.0)
TRIGLYCERIDES: 87 mg/dL (ref 0–149)
TSH: 2.05 u[IU]/mL (ref 0.450–4.500)
Total Protein: 7.8 g/dL (ref 6.0–8.5)
Uric Acid: 6.1 mg/dL (ref 3.7–8.6)
VLDL Cholesterol Cal: 17 mg/dL (ref 5–40)
WBC: 3.4 10*3/uL (ref 3.4–10.8)

## 2017-06-25 LAB — VITAMIN D 25 HYDROXY (VIT D DEFICIENCY, FRACTURES): VIT D 25 HYDROXY: 32.7 ng/mL (ref 30.0–100.0)

## 2017-06-25 LAB — LIPASE: LIPASE: 19 U/L (ref 13–78)

## 2017-07-04 ENCOUNTER — Other Ambulatory Visit: Payer: Self-pay

## 2017-07-04 ENCOUNTER — Ambulatory Visit: Payer: Self-pay | Admitting: Adult Health

## 2017-07-04 DIAGNOSIS — Z23 Encounter for immunization: Secondary | ICD-10-CM

## 2017-07-04 DIAGNOSIS — Z719 Counseling, unspecified: Secondary | ICD-10-CM

## 2017-07-08 ENCOUNTER — Telehealth: Payer: Self-pay | Admitting: *Deleted

## 2017-07-11 NOTE — Progress Notes (Addendum)
NURSE VISIT / Epic ERROR  Provider did not see this patient. Thanks, Marvell FullerMichelle Sreshta Cressler MSN, AGNP-C, FNP-C

## 2017-07-17 ENCOUNTER — Ambulatory Visit: Payer: Self-pay | Admitting: Medical

## 2018-02-18 IMAGING — US US SCROTUM
2 series · 14 of 25 positions shown · non-contrast
Comparison: None.

CLINICAL DATA: Initial evaluation for right testicular pain since
[REDACTED].

EXAM:
ULTRASOUND OF SCROTUM
TECHNIQUE: Complete ultrasound examination of the testicles, epididymis, and
other scrotal structures was performed.

[Series 1: us scrotum · 0.08mm/px · 13 of 139 slices shown (1 of 2)]
[im 1/139]
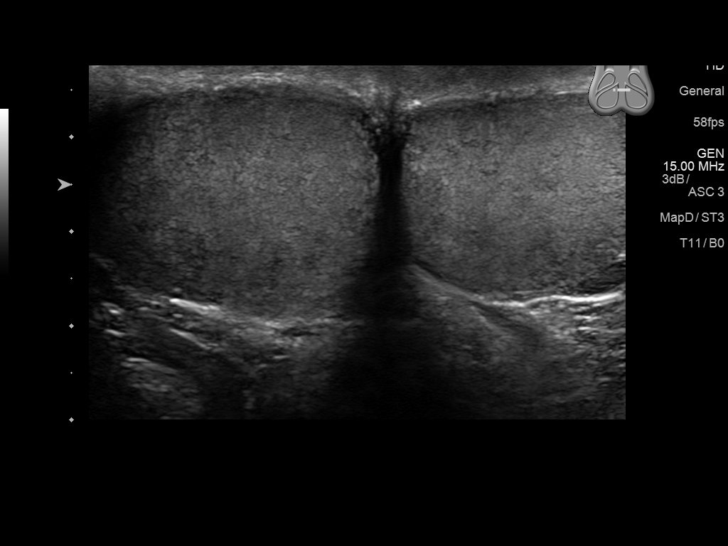
[im 13/139]
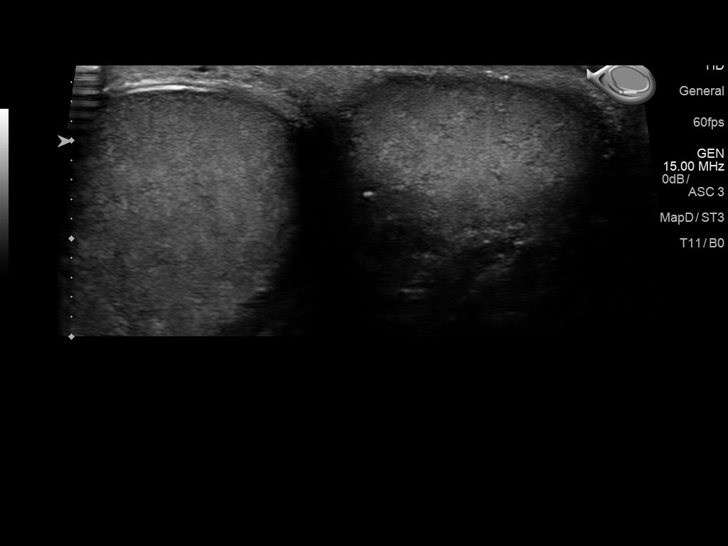
[im 25/139]
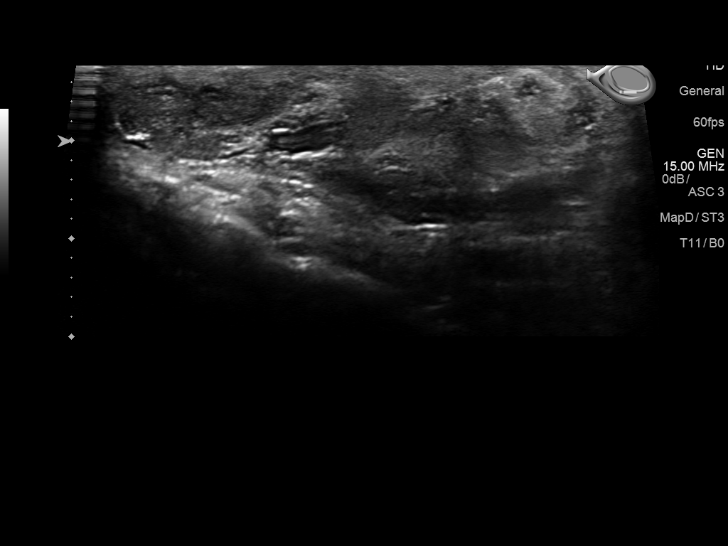
[im 37/139]
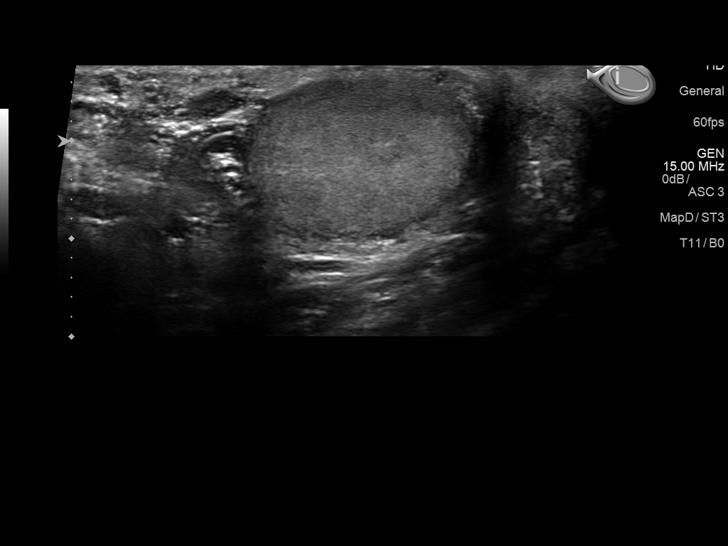
[im 49/139]
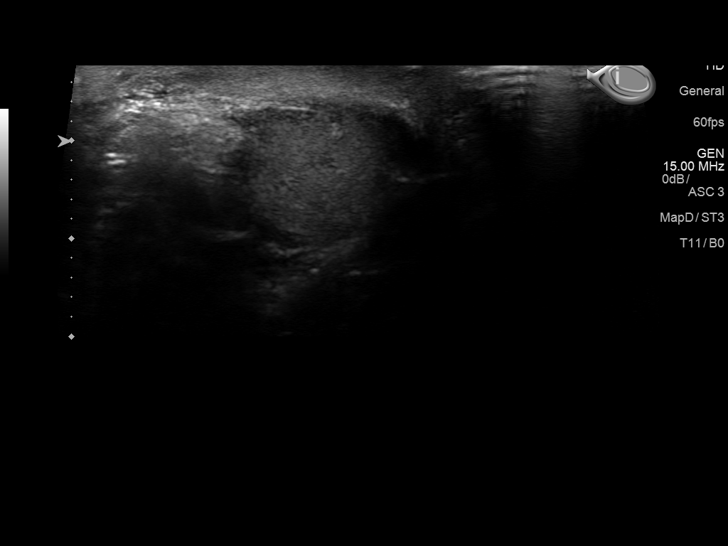
[im 55/139]
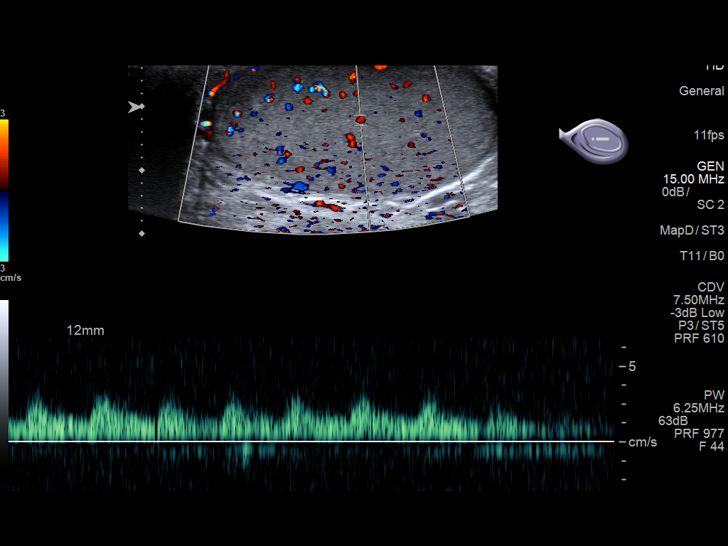
[im 67/139]
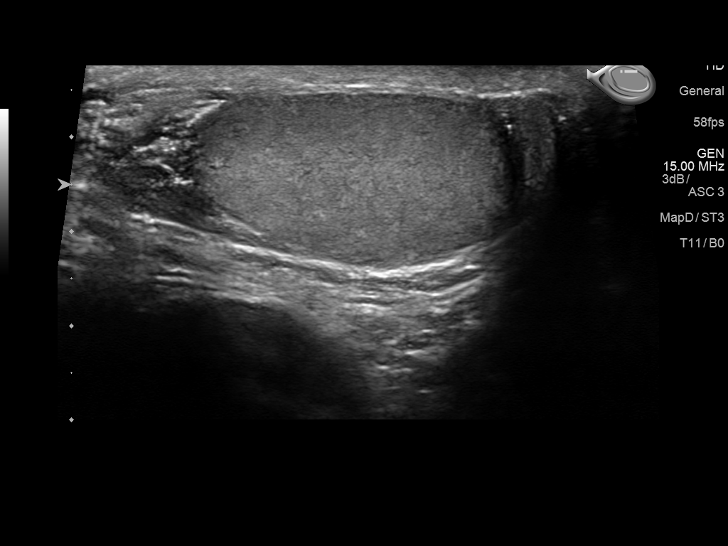
[im 79/139]
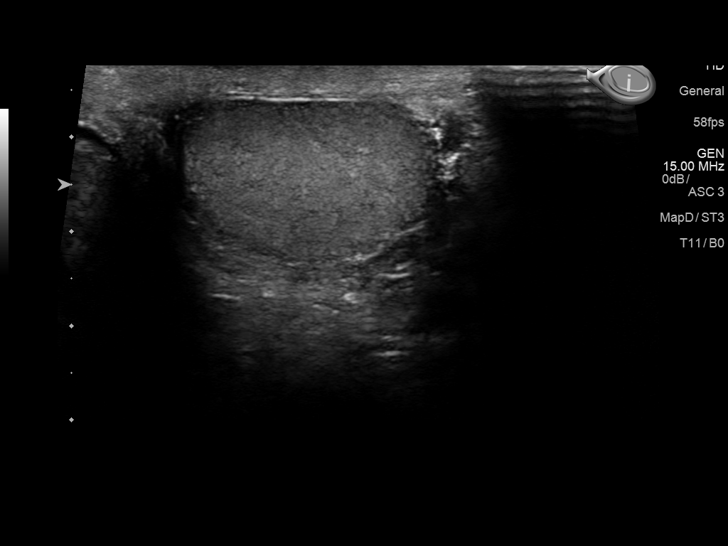
[im 91/139]
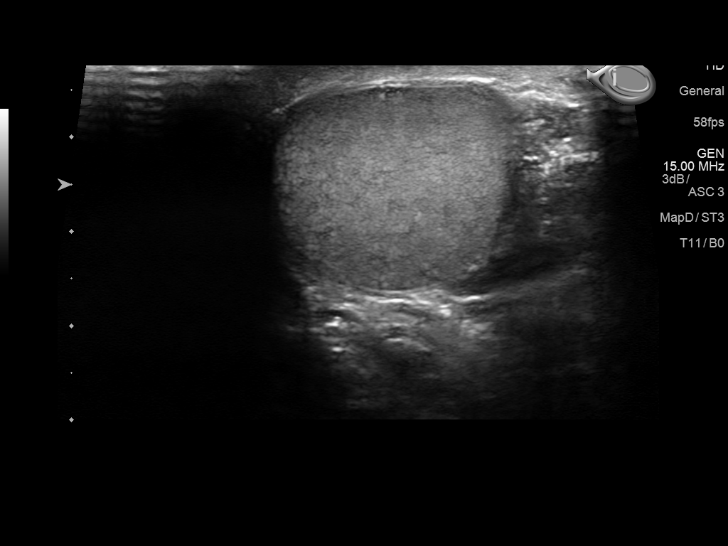
[im 97/139]
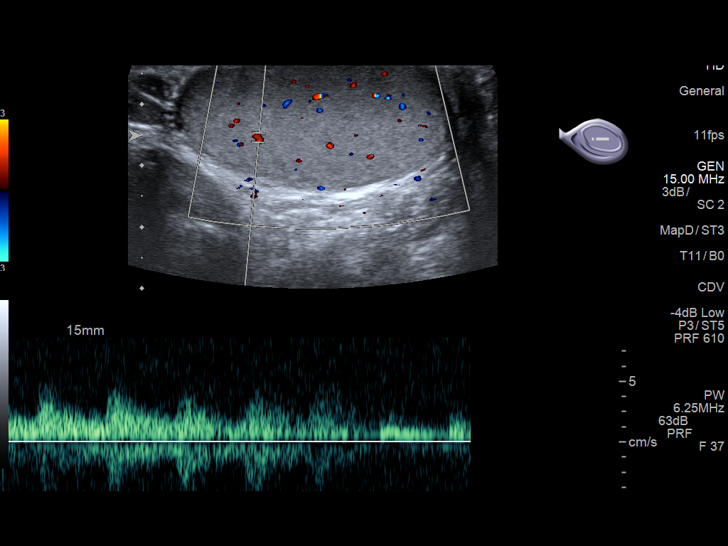
[im 109/139]
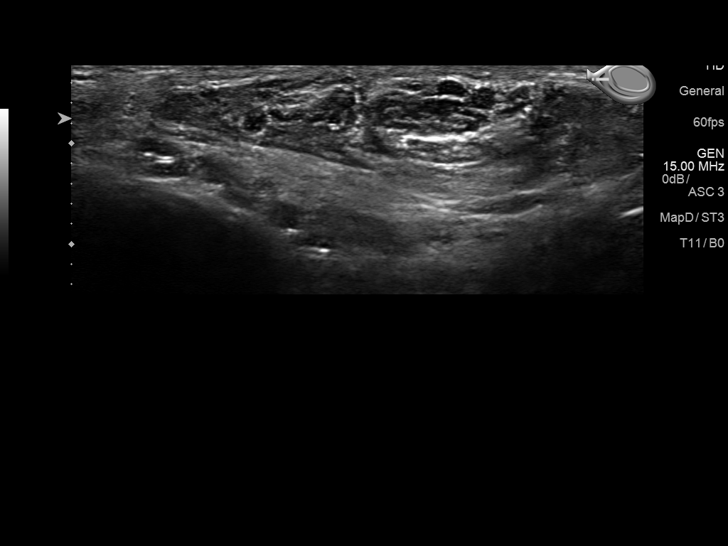
[im 121/139]
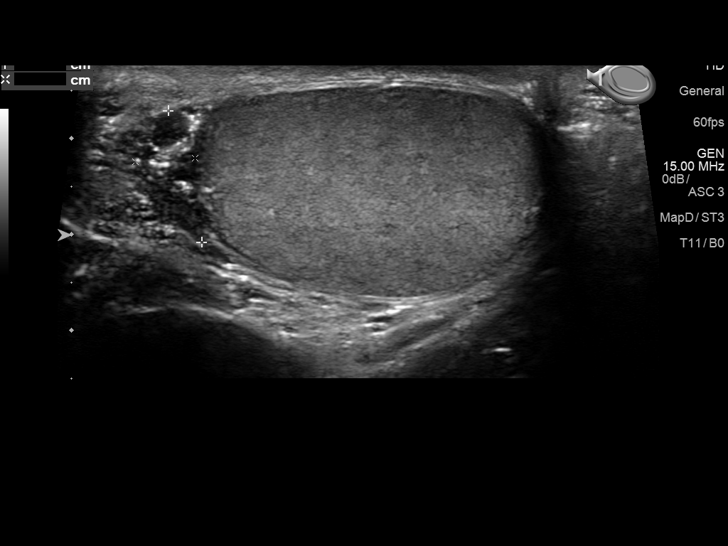
[im 133/139]
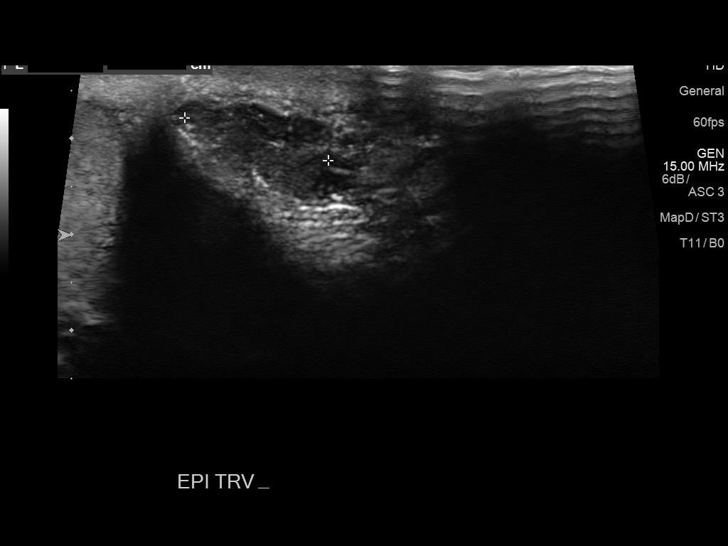

[Series 2001: us scrotum · 0.07mm/px · 1 of 4 slices shown (2 of 2)]
[im 1/4]
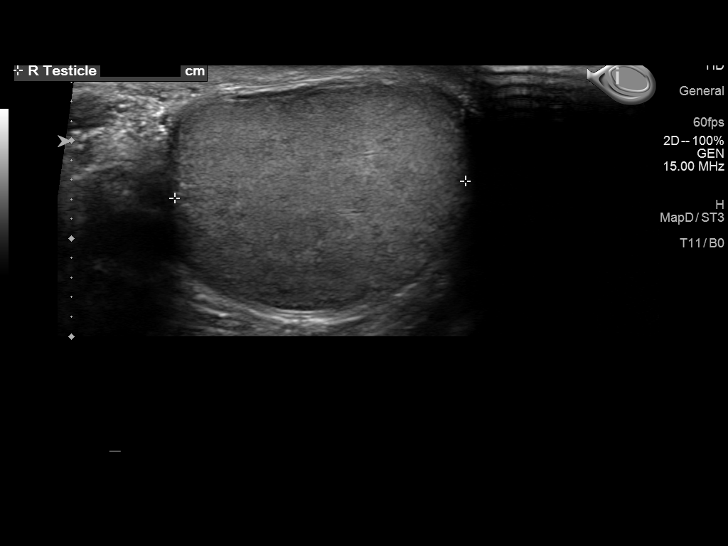

[14 of 25 positions shown; findings below may reference images not displayed]

FINDINGS: Right testicle

Measurements: 4.9 x 2.1 x 3.0 cm. No mass or microlithiasis
visualized.

Left testicle

Measurements: 4.7 x 1.7 x 2.7 cm. No mass or microlithiasis
visualized.

Right epididymis:  Normal in size and appearance.

Left epididymis:  Normal in size and appearance.

Hydrocele:  None visualized.

Varicocele:  None visualized.
IMPRESSION: Normal testicular ultrasound. No evidence for testicular mass or
other significant abnormality.

## 2019-10-07 ENCOUNTER — Ambulatory Visit: Payer: Self-pay | Admitting: Internal Medicine

## 2019-10-09 ENCOUNTER — Ambulatory Visit: Payer: Self-pay | Admitting: Internal Medicine

## 2020-04-05 DIAGNOSIS — Z20822 Contact with and (suspected) exposure to covid-19: Secondary | ICD-10-CM | POA: Diagnosis not present

## 2020-04-05 DIAGNOSIS — Z03818 Encounter for observation for suspected exposure to other biological agents ruled out: Secondary | ICD-10-CM | POA: Diagnosis not present

## 2020-07-01 DIAGNOSIS — Z23 Encounter for immunization: Secondary | ICD-10-CM | POA: Diagnosis not present

## 2020-08-19 ENCOUNTER — Ambulatory Visit: Payer: Self-pay | Admitting: Internal Medicine

## 2020-09-12 ENCOUNTER — Ambulatory Visit: Payer: Self-pay | Admitting: Internal Medicine

## 2020-09-27 ENCOUNTER — Ambulatory Visit (INDEPENDENT_AMBULATORY_CARE_PROVIDER_SITE_OTHER): Payer: BC Managed Care – PPO | Admitting: Cardiology

## 2020-09-27 ENCOUNTER — Other Ambulatory Visit: Payer: Self-pay

## 2020-09-27 ENCOUNTER — Encounter: Payer: Self-pay | Admitting: Cardiology

## 2020-09-27 VITALS — BP 130/80 | HR 76 | Ht 68.0 in | Wt 212.5 lb

## 2020-09-27 DIAGNOSIS — I459 Conduction disorder, unspecified: Secondary | ICD-10-CM | POA: Diagnosis not present

## 2020-09-27 DIAGNOSIS — Z8679 Personal history of other diseases of the circulatory system: Secondary | ICD-10-CM

## 2020-09-27 NOTE — Patient Instructions (Signed)

## 2020-09-27 NOTE — Progress Notes (Signed)
Cardiology Office Note:    Date:  09/27/2020   ID:  Tyler Davis, DOB 09-Apr-1986, MRN [id number]  PCP:  Oneita Hurt No   [facility] Providers Cardiologist:  Debbe Odea, MD     Referring MD: No ref. provider found   Chief Complaint  Patient presents with  . Other    Past due 12 month follow up. Patient C.o PVCs. Meds reviewed verbally with patient.     History of Present Illness:    Tyler Davis is a 35 y.o. male with a hx of mitral valve prolapse, internal hemorrhoids who presents to establish care.  Previously seen in the practice 4 years ago/2018 due to palpitations.  Work-up at that time included 48-hour Holter monitor and transthoracic echocardiogram.  Cardiac monitor did reveal rare PVCs, mild mitral valve prolapse was noted on echocardiogram.  He was lost to follow-up, due to work and being busy.  Denies any symptoms of chest pain or shortness of breath.  Has occasional skipped beats, denies dizziness, presyncope or syncope.  States his sister was diagnosed with a mitral valve prolapse.  Prior notes/studies Echo 09/19/2016 normal systolic and diastolic function, EF 60 to 65%, mild mitral valve prolapse. 48-hour Holter monitor 09/17/2016 no significant arrhythmias, rare PVCs  Past Medical History:  Diagnosis Date  . Internal hemorrhoids   . Mitral valve prolapse     Past Surgical History:  Procedure Laterality Date  . COLONOSCOPY      Current Medications: No outpatient medications have been marked as taking for the 09/27/20 encounter (Office Visit) with Debbe Odea, MD.     Allergies:   Patient has no known allergies.   Social History   Socioeconomic History  . Marital status: Single    Spouse name: Not on file  . Number of children: Not on file  . Years of education: Not on file  . Highest education level: Not on file  Occupational History  . Not on file  Tobacco Use  . Smoking status: Never Smoker  . Smokeless tobacco: Never Used  Vaping  Use  . Vaping Use: Never used  Substance and Sexual Activity  . Alcohol use: Yes    Alcohol/week: 2.0 standard drinks    Types: 2 Cans of beer per week    Comment: daily  . Drug use: No  . Sexual activity: Not on file  Other Topics Concern  . Not on file  Social History Narrative  . Not on file   Social Determinants of Health   Financial Resource Strain: Not on file  Food Insecurity: Not on file  Transportation Needs: Not on file  Physical Activity: Not on file  Stress: Not on file  Social Connections: Not on file     Family History: The patient's family history includes Allergies in his sister; Diabetes Mellitus II in his father; Hypertension in his father and mother; Mitral valve prolapse in his sister.  ROS:   Please see the history of present illness.     All other systems reviewed and are negative.  EKGs/Labs/Other Studies Reviewed:    The following studies were reviewed today:   EKG:  EKG is  ordered today.  The ekg ordered today demonstrates normal sinus rhythm, normal ECG  Recent Labs: No results found for requested labs within last 8760 hours.  Recent Lipid Panel    Component Value Date/Time   CHOL 194 06/24/2017 0947   TRIG 87 06/24/2017 0947   HDL 63 06/24/2017 0947   CHOLHDL 3.1 06/24/2017 0947  LDLCALC 114 (H) 06/24/2017 0947     Risk Assessment/Calculations:      Physical Exam:    VS:  BP 130/80 (BP Location: Left Arm, Patient Position: Sitting, Cuff Size: Normal)   Pulse 76   Ht 5\' 8"  (1.727 m)   Wt 212 lb 8 oz (96.4 kg)   SpO2 98%   BMI 32.31 kg/m     Wt Readings from Last 3 Encounters:  09/27/20 212 lb 8 oz (96.4 kg)  06/24/17 196 lb 12.8 oz (89.3 kg)  11/23/16 195 lb (88.5 kg)     GEN:  Well nourished, well developed in no acute distress HEENT: Normal NECK: No JVD; No carotid bruits LYMPHATICS: No lymphadenopathy CARDIAC: RRR, no murmurs, rubs, gallops RESPIRATORY:  Clear to auscultation without rales, wheezing or rhonchi   ABDOMEN: Soft, non-tender, non-distended MUSCULOSKELETAL:  No edema; No deformity  SKIN: Warm and dry NEUROLOGIC:  Alert and oriented x 3 PSYCHIATRIC:  Normal affect   ASSESSMENT:    1. Hx of mitral valve prolapse   2. Skipped heart beats    PLAN:    In order of problems listed above:  1. History of mild mitral valve prolapse.  Echo in 2018 showed no evidence for regurgitation.  Repeat echocardiogram to evaluate for any structural changes or presence of regurgitation. 2. Skipped heartbeat, likely from PVCs.  Not clinically significant.  Continue to monitor.  Follow-up after echocardiogram   Medication Adjustments/Labs and Tests Ordered: Current medicines are reviewed at length with the patient today.  Concerns regarding medicines are outlined above.  Orders Placed This Encounter  Procedures  . EKG 12-Lead  . ECHOCARDIOGRAM COMPLETE   No orders of the defined types were placed in this encounter.   Patient Instructions  Medication Instructions:   Your physician recommends that you continue on your current medications as directed. Please refer to the Current Medication list given to you today.  *If you need a refill on your cardiac medications before your next appointment, please call your pharmacy*   Lab Work: None ordered If you have labs (blood work) drawn today and your tests are completely normal, you will receive your results only by: 2019 MyChart Message (if you have MyChart) OR . A paper copy in the mail If you have any lab test that is abnormal or we need to change your treatment, we will call you to review the results.   Testing/Procedures:  1. Your physician has requested that you have an echocardiogram. Echocardiography is a painless test that uses sound waves to create images of your heart. It provides your doctor with information about the size and shape of your heart and how well your heart's chambers and valves are working. This procedure takes  approximately one hour. There are no restrictions for this procedure.     Follow-Up: At [facility], you and your health needs are our priority.  As part of our continuing mission to provide you with exceptional heart care, we have created designated Provider Care Teams.  These Care Teams include your primary Cardiologist (physician) and Advanced Practice Providers (APPs -  Physician Assistants and Nurse Practitioners) who all work together to provide you with the care you need, when you need it.  We recommend signing up for the patient portal called "MyChart".  Sign up information is provided on this After Visit Summary.  MyChart is used to connect with patients for Virtual Visits (Telemedicine).  Patients are able to view lab/test results, encounter notes, upcoming appointments, etc.  Non-urgent messages can be sent to your provider as well.   To learn more about what you can do with MyChart, go to [email or web address].    Your next appointment:   Follow up after Echo   The format for your next appointment:   In Person  Provider:   Debbe Odea, MD   Other Instructions      Signed, Debbe Odea, MD  09/27/2020 10:30 AM    [facility]

## 2020-10-27 ENCOUNTER — Other Ambulatory Visit: Payer: BC Managed Care – PPO

## 2020-11-04 ENCOUNTER — Ambulatory Visit: Payer: BC Managed Care – PPO | Admitting: Cardiology

## 2020-11-25 ENCOUNTER — Ambulatory Visit (INDEPENDENT_AMBULATORY_CARE_PROVIDER_SITE_OTHER): Payer: BC Managed Care – PPO

## 2020-11-25 ENCOUNTER — Other Ambulatory Visit: Payer: Self-pay

## 2020-11-25 DIAGNOSIS — Z8679 Personal history of other diseases of the circulatory system: Secondary | ICD-10-CM

## 2020-11-25 DIAGNOSIS — I341 Nonrheumatic mitral (valve) prolapse: Secondary | ICD-10-CM

## 2020-11-25 LAB — ECHOCARDIOGRAM COMPLETE
AR max vel: 2.87 cm2
AV Area VTI: 3.01 cm2
AV Area mean vel: 3.09 cm2
AV Mean grad: 2 mmHg
AV Peak grad: 4 mmHg
Ao pk vel: 1 m/s
Area-P 1/2: 4.15 cm2
Calc EF: 52.1 %
S' Lateral: 3.6 cm
Single Plane A2C EF: 53.2 %
Single Plane A4C EF: 51.9 %

## 2020-12-01 ENCOUNTER — Other Ambulatory Visit: Payer: Self-pay

## 2020-12-01 ENCOUNTER — Encounter: Payer: Self-pay | Admitting: Cardiology

## 2020-12-01 ENCOUNTER — Ambulatory Visit (INDEPENDENT_AMBULATORY_CARE_PROVIDER_SITE_OTHER): Payer: BC Managed Care – PPO | Admitting: Cardiology

## 2020-12-01 VITALS — BP 120/80 | HR 74 | Ht 67.0 in | Wt 211.0 lb

## 2020-12-01 DIAGNOSIS — I341 Nonrheumatic mitral (valve) prolapse: Secondary | ICD-10-CM | POA: Diagnosis not present

## 2020-12-01 NOTE — Progress Notes (Signed)
Cardiology Office Note:    Date:  12/01/2020   ID:  Tyler Davis, DOB June 15, 1985, MRN [id number]  PCP:  Oneita Hurt No   [facility] Providers Cardiologist:  Debbe Odea, MD     Referring MD: No ref. provider found   Chief Complaint  Patient presents with   Other    Follow up post ECHO. Med reviewed verbally with patient.     History of Present Illness:    Tyler Davis is a 35 y.o. male with a hx of mitral valve prolapse, internal hemorrhoids who presents for follow-up.  Last seen to establish care due to history of mitral valve prolapse on previous echocardiogram 4 years ago.  A repeat echocardiogram was ordered to evaluate any worsening in prolapse or other structural abnormalities. He feels well, denies any cardiac symptoms of chest pain, shortness of breath.  Prior notes/studies Echo 09/19/2016 normal systolic and diastolic function, EF 60 to 65%, mild mitral valve prolapse. 48-hour Holter monitor 09/17/2016 no significant arrhythmias, rare PVCs  Past Medical History:  Diagnosis Date   Internal hemorrhoids    Mitral valve prolapse     Past Surgical History:  Procedure Laterality Date   COLONOSCOPY      Current Medications: No outpatient medications have been marked as taking for the 12/01/20 encounter (Office Visit) with Debbe Odea, MD.     Allergies:   Patient has no known allergies.   Social History   Socioeconomic History   Marital status: Single    Spouse name: Not on file   Number of children: Not on file   Years of education: Not on file   Highest education level: Not on file  Occupational History   Not on file  Tobacco Use   Smoking status: Never   Smokeless tobacco: Never  Vaping Use   Vaping Use: Never used  Substance and Sexual Activity   Alcohol use: Yes    Alcohol/week: 2.0 standard drinks    Types: 2 Cans of beer per week    Comment: daily   Drug use: No   Sexual activity: Not on file  Other Topics Concern   Not on file   Social History Narrative   Not on file   Social Determinants of Health   Financial Resource Strain: Not on file  Food Insecurity: Not on file  Transportation Needs: Not on file  Physical Activity: Not on file  Stress: Not on file  Social Connections: Not on file     Family History: The patient's family history includes Allergies in his sister; Diabetes Mellitus II in his father; Hypertension in his father and mother; Mitral valve prolapse in his sister.  ROS:   Please see the history of present illness.     All other systems reviewed and are negative.  EKGs/Labs/Other Studies Reviewed:    The following studies were reviewed today:   EKG:  EKG is  ordered today.  The ekg ordered today demonstrates normal sinus rhythm, normal ECG  Recent Labs: No results found for requested labs within last 8760 hours.  Recent Lipid Panel    Component Value Date/Time   CHOL 194 06/24/2017 0947   TRIG 87 06/24/2017 0947   HDL 63 06/24/2017 0947   CHOLHDL 3.1 06/24/2017 0947   LDLCALC 114 (H) 06/24/2017 0947     Risk Assessment/Calculations:      Physical Exam:    VS:  BP 120/80 (BP Location: Left Arm, Patient Position: Sitting, Cuff Size: Normal)   Pulse 74  Ht 5\' 7"  (1.702 m)   Wt 211 lb (95.7 kg)   SpO2 98%   BMI 33.05 kg/m     Wt Readings from Last 3 Encounters:  12/01/20 211 lb (95.7 kg)  09/27/20 212 lb 8 oz (96.4 kg)  06/24/17 196 lb 12.8 oz (89.3 kg)     GEN:  Well nourished, well developed in no acute distress HEENT: Normal NECK: No JVD; No carotid bruits LYMPHATICS: No lymphadenopathy CARDIAC: RRR, no murmurs, rubs, gallops RESPIRATORY:  Clear to auscultation without rales, wheezing or rhonchi  ABDOMEN: Soft, non-tender, non-distended MUSCULOSKELETAL:  No edema; No deformity  SKIN: Warm and dry NEUROLOGIC:  Alert and oriented x 3 PSYCHIATRIC:  Normal affect   ASSESSMENT:    1. Mitral valve prolapse     PLAN:    In order of problems listed  above:  Mitral valve prolapse, repeat echo showing mild bileaflet prolapse, mild MR.  Normal EF, normal diastolic function.  No other gross structural abnormalities noted.  Patient made aware of results.  Plan for serial monitoring yearly with echocardiograms.  Follow-up in 1 year after repeat echo   Medication Adjustments/Labs and Tests Ordered: Current medicines are reviewed at length with the patient today.  Concerns regarding medicines are outlined above.  Orders Placed This Encounter  Procedures   ECHOCARDIOGRAM COMPLETE    No orders of the defined types were placed in this encounter.   Patient Instructions  Medication Instructions:   Your physician recommends that you continue on your current medications as directed. Please refer to the Current Medication list given to you today.  *If you need a refill on your cardiac medications before your next appointment, please call your pharmacy*   Lab Work:  None Ordered  If you have labs (blood work) drawn today and your tests are completely normal, you will receive your results only by: MyChart Message (if you have MyChart) OR A paper copy in the mail If you have any lab test that is abnormal or we need to change your treatment, we will call you to review the results.   Testing/Procedures:  Echocardiogram - in one year  Please return to [facility] on ______________ at _______________ AM/PM for an Echocardiogram. Your physician has requested that you have an echocardiogram. Echocardiography is a painless test that uses sound waves to create images of your heart. It provides your doctor with information about the size and shape of your heart and how well your heart's chambers and valves are working. This procedure takes approximately one hour. There are no restrictions for this procedure. Please note; depending on visual quality an IV may need to be placed.     Follow-Up: At [facility], you and your health  needs are our priority.  As part of our continuing mission to provide you with exceptional heart care, we have created designated Provider Care Teams.  These Care Teams include your primary Cardiologist (physician) and Advanced Practice Providers (APPs -  Physician Assistants and Nurse Practitioners) who all work together to provide you with the care you need, when you need it.  We recommend signing up for the patient portal called "MyChart".  Sign up information is provided on this After Visit Summary.  MyChart is used to connect with patients for Virtual Visits (Telemedicine).  Patients are able to view lab/test results, encounter notes, upcoming appointments, etc.  Non-urgent messages can be sent to your provider as well.   To learn more about what you can do with  MyChart, go to [email or web address].    Your next appointment:   12 month(s) - After Echocardiogram  The format for your next appointment:   In Person  Provider:   You may see Debbe Odea, MD or one of the following Advanced Practice Providers on your designated Care Team:   Nicolasa Ducking, NP Eula Listen, PA-C Marisue Ivan, PA-C Cadence [place], [place] Gillian Shields, NP     Signed, Debbe Odea, MD  12/01/2020 11:06 AM    [facility]

## 2020-12-01 NOTE — Patient Instructions (Signed)
Medication Instructions:   Your physician recommends that you continue on your current medications as directed. Please refer to the Current Medication list given to you today.  *If you need a refill on your cardiac medications before your next appointment, please call your pharmacy*   Lab Work:  None Ordered  If you have labs (blood work) drawn today and your tests are completely normal, you will receive your results only by: MyChart Message (if you have MyChart) OR A paper copy in the mail If you have any lab test that is abnormal or we need to change your treatment, we will call you to review the results.   Testing/Procedures:  Echocardiogram - in one year  Please return to [facility] on ______________ at _______________ AM/PM for an Echocardiogram. Your physician has requested that you have an echocardiogram. Echocardiography is a painless test that uses sound waves to create images of your heart. It provides your doctor with information about the size and shape of your heart and how well your heart's chambers and valves are working. This procedure takes approximately one hour. There are no restrictions for this procedure. Please note; depending on visual quality an IV may need to be placed.     Follow-Up: At [facility], you and your health needs are our priority.  As part of our continuing mission to provide you with exceptional heart care, we have created designated Provider Care Teams.  These Care Teams include your primary Cardiologist (physician) and Advanced Practice Providers (APPs -  Physician Assistants and Nurse Practitioners) who all work together to provide you with the care you need, when you need it.  We recommend signing up for the patient portal called "MyChart".  Sign up information is provided on this After Visit Summary.  MyChart is used to connect with patients for Virtual Visits (Telemedicine).  Patients are able to view lab/test results,  encounter notes, upcoming appointments, etc.  Non-urgent messages can be sent to your provider as well.   To learn more about what you can do with MyChart, go to [email or web address].    Your next appointment:   12 month(s) - After Echocardiogram  The format for your next appointment:   In Person  Provider:   You may see Debbe Odea, MD or one of the following Advanced Practice Providers on your designated Care Team:   Nicolasa Ducking, NP Eula Listen, PA-C Marisue Ivan, PA-C Cadence [place], [place] Gillian Shields, NP

## 2021-11-21 ENCOUNTER — Other Ambulatory Visit: Payer: BC Managed Care – PPO

## 2021-12-12 ENCOUNTER — Other Ambulatory Visit: Payer: Self-pay | Admitting: Cardiology

## 2021-12-12 DIAGNOSIS — I059 Rheumatic mitral valve disease, unspecified: Secondary | ICD-10-CM

## 2021-12-20 ENCOUNTER — Ambulatory Visit (INDEPENDENT_AMBULATORY_CARE_PROVIDER_SITE_OTHER): Payer: BC Managed Care – PPO

## 2021-12-20 DIAGNOSIS — I059 Rheumatic mitral valve disease, unspecified: Secondary | ICD-10-CM | POA: Diagnosis not present

## 2021-12-21 LAB — ECHOCARDIOGRAM COMPLETE
AR max vel: 3.55 cm2
AV Area VTI: 3.25 cm2
AV Area mean vel: 3.19 cm2
AV Mean grad: 2 mmHg
AV Peak grad: 2.9 mmHg
Ao pk vel: 0.85 m/s
Area-P 1/2: 3.56 cm2
Calc EF: 45.6 %
S' Lateral: 2.8 cm
Single Plane A2C EF: 45.2 %
Single Plane A4C EF: 45.5 %

## 2021-12-25 ENCOUNTER — Other Ambulatory Visit: Payer: Self-pay

## 2022-04-25 ENCOUNTER — Encounter: Payer: Self-pay | Admitting: Nurse Practitioner

## 2022-04-25 ENCOUNTER — Ambulatory Visit (INDEPENDENT_AMBULATORY_CARE_PROVIDER_SITE_OTHER): Payer: Self-pay | Admitting: Nurse Practitioner

## 2022-04-25 VITALS — BP 122/80 | HR 80 | Temp 98.1°F

## 2022-04-25 DIAGNOSIS — Z2989 Encounter for other specified prophylactic measures: Secondary | ICD-10-CM

## 2022-04-25 MED ORDER — ATOVAQUONE-PROGUANIL HCL 250-100 MG PO TABS: 1.0000 | ORAL_TABLET | Freq: Every day | ORAL | 0 refills | Status: DC

## 2022-04-25 NOTE — Progress Notes (Signed)
[facility] [address] [place], [place] [address] [phone]  Office Visit Note  Patient Name: Tyler Davis Date of Birth [id number]  Medical Record number [id number]  Date of Service: 04/25/2022  Chief Complaint  Patient presents with   Travel Consult    [place]. Leaving Monday.      HPI 36 year old male leaving for 3 week trip to [place] trip next week. He is from [place]- unsure if he has ever taken anything for Malaria prophylaxis in the past. No known history of Malaria  Will be traveling for pleasure to visit family  Current Medication:  Outpatient Encounter Medications as of 04/25/2022  Medication Sig   atovaquone-proguanil (MALARONE) 250-100 MG TABS tablet Take 1 tablet by mouth daily. Start 2 days prior to travel, continue for 7 days after returning from travel   No facility-administered encounter medications on file as of 04/25/2022.      Medical History: Past Medical History:  Diagnosis Date   Internal hemorrhoids    Mitral valve prolapse      Vital Signs: BP 122/80 (BP Location: Left Arm, Patient Position: Sitting, Cuff Size: Normal)   Pulse 80   Temp 98.1 F (36.7 C) (Tympanic)   SpO2 98%     Physical Exam HENT:     Head: Normocephalic.  Pulmonary:     Effort: Pulmonary effort is normal.  Neurological:     General: No focal deficit present.     Mental Status: He is alert and oriented to person, place, and time. Mental status is at baseline.  Psychiatric:        Mood and Affect: Mood normal.       Assessment/Plan: 1. Need for malaria prophylaxis  - atovaquone-proguanil (MALARONE) 250-100 MG TABS tablet; Take 1 tablet by mouth daily. Start 2 days prior to travel, continue for 7 days after returning from travel  Dispense: 40 tablet; Refill: 0    General Counseling: Dickson verbalizes understanding of the findings of todays visit and agrees with plan of treatment. I have discussed any further diagnostic evaluation  that may be needed or ordered today. We also reviewed his medications today. he has been encouraged to call the office with any questions or concerns that should arise related to todays visit.     Meds ordered this encounter  Medications   atovaquone-proguanil (MALARONE) 250-100 MG TABS tablet    Sig: Take 1 tablet by mouth daily. Start 2 days prior to travel, continue for 7 days after returning from travel    Dispense:  40 tablet    Refill:  0    Time spent:10 Minutes   Viviano Simas [facility] Nurse Practitioner

## 2023-05-02 ENCOUNTER — Ambulatory Visit (INDEPENDENT_AMBULATORY_CARE_PROVIDER_SITE_OTHER): Payer: Self-pay | Admitting: Adult Health

## 2023-05-02 DIAGNOSIS — Z2989 Encounter for other specified prophylactic measures: Secondary | ICD-10-CM

## 2023-05-02 MED ORDER — ATOVAQUONE-PROGUANIL HCL 250-100 MG PO TABS: 1.0000 | ORAL_TABLET | Freq: Every day | ORAL | 0 refills | Status: AC

## 2023-05-02 NOTE — Progress Notes (Signed)
Virtual Visit via Video Note  I connected with Tyler Davis on 05/02/23 at  3:00 PM EST by a video enabled telemedicine application and verified that I am speaking with the correct person using two identifiers.  Location: Patient: Home Provider: Office   I discussed the limitations of evaluation and management by telemedicine and the availability of in person appointments. The patient expressed understanding and agreed to proceed.  History of Present Illness: Pt I need of malaria prophylaxis to travel to his home country of [place].    Observations/Objective: Speaking in full sentences.   Assessment and Plan: 1. Need for malaria prophylaxis Take meds as discussed.  - atovaquone-proguanil (MALARONE) 250-100 MG TABS tablet; Take 1 tablet by mouth daily. Start 2 days prior to travel, continue for 7 days after returning from travel  Dispense: 55 tablet; Refill: 0     Follow Up Instructions:    I discussed the assessment and treatment plan with the patient. The patient was provided an opportunity to ask questions and all were answered. The patient agreed with the plan and demonstrated an understanding of the instructions.   The patient was advised to call back or seek an in-person evaluation if the symptoms worsen or if the condition fails to improve as anticipated.  I provided 10 minutes of non-face-to-face time during this encounter.   Johnna Acosta, NP
